# Patient Record
Sex: Female | Born: 1965 | Race: White | Hispanic: No | Marital: Married | Smoking: Never smoker
Health system: Southern US, Community
[De-identification: ages and names within clinical notes are randomized; demographics above are authoritative.]

---

## 2005-09-12 ENCOUNTER — Ambulatory Visit: Payer: Self-pay | Admitting: Family Medicine

## 2006-09-20 ENCOUNTER — Ambulatory Visit: Payer: Self-pay | Admitting: Family Medicine

## 2007-10-09 ENCOUNTER — Ambulatory Visit: Payer: Self-pay | Admitting: Family Medicine

## 2008-11-24 ENCOUNTER — Ambulatory Visit: Payer: Self-pay | Admitting: Family Medicine

## 2009-11-30 ENCOUNTER — Ambulatory Visit: Payer: Self-pay | Admitting: Family Medicine

## 2010-12-06 ENCOUNTER — Ambulatory Visit: Payer: Self-pay | Admitting: Family Medicine

## 2012-05-14 ENCOUNTER — Ambulatory Visit: Payer: Self-pay | Admitting: Family Medicine

## 2013-05-15 ENCOUNTER — Ambulatory Visit: Payer: Self-pay | Admitting: Family Medicine

## 2014-05-26 ENCOUNTER — Ambulatory Visit: Payer: Self-pay | Admitting: Family Medicine

## 2014-06-09 ENCOUNTER — Ambulatory Visit: Payer: Self-pay | Admitting: Gastroenterology

## 2014-12-04 HISTORY — PX: COLONOSCOPY: SHX174

## 2015-11-08 ENCOUNTER — Other Ambulatory Visit: Payer: Self-pay | Admitting: Family Medicine

## 2015-11-08 DIAGNOSIS — Z1231 Encounter for screening mammogram for malignant neoplasm of breast: Secondary | ICD-10-CM

## 2015-11-15 ENCOUNTER — Ambulatory Visit
Admission: RE | Admit: 2015-11-15 | Discharge: 2015-11-15 | Disposition: A | Discharge status: Home / Self Care | Payer: Managed Care, Other (non HMO) | Source: Ambulatory Visit | Attending: Family Medicine | Admitting: Family Medicine

## 2015-11-15 DIAGNOSIS — Z1231 Encounter for screening mammogram for malignant neoplasm of breast: Secondary | ICD-10-CM | POA: Insufficient documentation

## 2017-02-06 ENCOUNTER — Other Ambulatory Visit: Payer: Self-pay | Admitting: Family Medicine

## 2017-02-06 DIAGNOSIS — Z1231 Encounter for screening mammogram for malignant neoplasm of breast: Secondary | ICD-10-CM

## 2017-03-06 ENCOUNTER — Ambulatory Visit
Admission: RE | Admit: 2017-03-06 | Discharge: 2017-03-06 | Disposition: A | Discharge status: Home / Self Care | Payer: Managed Care, Other (non HMO) | Source: Ambulatory Visit | Attending: Family Medicine | Admitting: Family Medicine

## 2017-03-06 DIAGNOSIS — Z1231 Encounter for screening mammogram for malignant neoplasm of breast: Secondary | ICD-10-CM | POA: Diagnosis present

## 2018-02-07 ENCOUNTER — Other Ambulatory Visit: Payer: Self-pay | Admitting: Family Medicine

## 2018-02-07 DIAGNOSIS — Z1231 Encounter for screening mammogram for malignant neoplasm of breast: Secondary | ICD-10-CM

## 2018-03-07 ENCOUNTER — Ambulatory Visit
Admission: RE | Admit: 2018-03-07 | Discharge: 2018-03-07 | Disposition: A | Discharge status: Home / Self Care | Payer: 59 | Source: Ambulatory Visit | Attending: Family Medicine | Admitting: Family Medicine

## 2018-03-07 DIAGNOSIS — Z1231 Encounter for screening mammogram for malignant neoplasm of breast: Secondary | ICD-10-CM

## 2018-03-27 NOTE — H&P (Signed)
Tammy Baldwin is a 52 y.o. female here for TAh and bilateral salpingectomy . Referred from [facility] for heavy menses for the last 1 yr . Bleeds for 1-2 weeks at a time sometimes flood and has to change pad q 1 hr  G1P1 with c/s   u/s :Uterus retroverted  Fibroids seen: 1)Lt lat=3.5cm     2)Rt lat=2.3cm    3)Lt mid=1.7cm    4)fundal=2.9cm    5)fundal=8.2cm (seen transabdominally)  Endometrium=13.30mm  No free fluid seen in CDS's  LOV appears wnl  ROV contains 2 simple cysts: 1)2.1cm     2)1.3cm   EMBX : neg   pap : [facility]    Past Medical History:  has a past medical history of Epilepsy (CMS-HCC) and Gave birth to child recently (1999).  Past Surgical History:  has a past surgical history that includes Colonoscopy (06/09/2014) and Cesarean section (1999). Family History: family history includes Allergic rhinitis in her brother and mother; Atrial fibrillation (Abnormal heart rhythm sometimes requiring treatment with blood thinners) in her maternal aunt; Epilepsy in her brother; HIV in her brother; Stroke in her maternal aunt and maternal aunt. Social History:  reports that she has never smoked. She has never used smokeless tobacco. She reports that she does not drink alcohol or use drugs. OB/GYN History:          OB History    Gravida  1   Para  1   Term  1   Preterm      AB      Living  1     SAB      TAB      Ectopic      Molar      Multiple      Live Births  1          Allergies: is allergic to penicillins. Medications:  Current Outpatient Medications:  .  ascorbic acid (VITAMIN C) 1000 MG tablet, Take 1,000 mg by mouth once daily., Disp: , Rfl:  .  multivitamin (MULTIVITAMIN) tablet, Take 1 tablet by mouth once daily., Disp: , Rfl:  .  omega-3 fatty acids-vitamin E (FISH OIL) 1,000 mg, Take 1 tablet by mouth once daily., Disp: , Rfl:  .  tranexamic acid (LYSTEDA) 650 mg tablet, Take 2 tablets (1,300 mg total) by mouth 3 (three) times daily Take  for a maximum of 5 days during monthly menstruation., Disp: 30 tablet, Rfl: 3 .  VITAMIN B COMPLEX ORAL, Take by mouth once daily., Disp: , Rfl:   Review of Systems: General:                      No fatigue or weight loss Eyes:                           No vision changes Ears:                            No hearing difficulty Respiratory:                No cough or shortness of breath Pulmonary:                  No asthma or shortness of breath Cardiovascular:           No chest pain, palpitations, dyspnea on exertion Gastrointestinal:  No abdominal bloating, chronic diarrhea, constipations, masses, pain or hematochezia Genitourinary:             No hematuria, dysuria, abnormal vaginal discharge, pelvic pain,++ Menometrorrhagia Lymphatic:                   No swollen lymph nodes Musculoskeletal:         No muscle weakness Neurologic:                  No extremity weakness, syncope, seizure disorder Psychiatric:                  No history of depression, delusions or suicidal/homicidal ideation    Exam:      Vitals:   03/19/18 1142  BP: 127/77  Pulse: 70    Body mass index is 30.21 kg/m.  WDWN white/ female in NAD   Lungs: CTA  CV : RRR without murmur    Neck:  no thyromegaly Abdomen: soft , 20 week firm utx just inferior to umbilicus Pelvic: tanner stage 5 ,  External genitalia: vulva /labia no lesions Urethra: no prolapse Vagina: normal physiologic d/c Cervix: no lesions, no cervical motion tenderness   Uterus: 20 weeks irregular shape, mobile   Adnexa: no mass,  non-tender   Rectovaginal:  :   Impression:   The primary encounter diagnosis was Abnormal uterine bleeding (AUB), unspecified. A diagnosis of Uterine leiomyoma, unspecified location was also pertinent to this visit.    Plan:   Recommend surgical intervention ie  TAH and bilateral salpingectomy  Risks of the procedure have been discussed with her  Including organ injury , dvt ,  blood transfusions risks , infection . All questions answered .          Caroline Sauger, MD       Electronically signed by Caroline Sauger, MD on 03/27/2018 12:12 PM      Office Visit on 03/19/2018

## 2018-03-28 ENCOUNTER — Encounter
Admission: RE | Admit: 2018-03-28 | Discharge: 2018-03-28 | Disposition: A | Discharge status: Home / Self Care | Payer: 59 | Source: Ambulatory Visit | Attending: Obstetrics and Gynecology | Admitting: Obstetrics and Gynecology

## 2018-03-28 ENCOUNTER — Other Ambulatory Visit: Payer: Self-pay

## 2018-03-28 DIAGNOSIS — Z01812 Encounter for preprocedural laboratory examination: Secondary | ICD-10-CM | POA: Insufficient documentation

## 2018-03-28 LAB — TYPE AND SCREEN
ABO/RH(D): A POS
ANTIBODY SCREEN: NEGATIVE

## 2018-03-28 LAB — CBC
HCT: 43.6 % (ref 35.0–47.0)
Hemoglobin: 14.9 g/dL (ref 12.0–16.0)
MCH: 32.5 pg (ref 26.0–34.0)
MCHC: 34.1 g/dL (ref 32.0–36.0)
MCV: 95.4 fL (ref 80.0–100.0)
PLATELETS: 299 10*3/uL (ref 150–440)
RBC: 4.57 MIL/uL (ref 3.80–5.20)
RDW: 13.3 % (ref 11.5–14.5)
WBC: 6 10*3/uL (ref 3.6–11.0)

## 2018-03-28 LAB — BASIC METABOLIC PANEL
Anion gap: 7 (ref 5–15)
BUN: 15 mg/dL (ref 6–20)
CALCIUM: 8.8 mg/dL — AB (ref 8.9–10.3)
CO2: 26 mmol/L (ref 22–32)
CREATININE: 0.95 mg/dL (ref 0.44–1.00)
Chloride: 107 mmol/L (ref 101–111)
GFR calc Af Amer: >60 mL/min (ref >60)
Glucose, Bld: 85 mg/dL (ref 65–99)
Potassium: 4 mmol/L (ref 3.5–5.1)
SODIUM: 140 mmol/L (ref 135–145)

## 2018-03-28 NOTE — Patient Instructions (Signed)
Your procedure is scheduled on: Monday 04/01/18 Report to [facility]. To find out your arrival time please call [phone] between 1PM - 3PM on Friday 03/29/18.  Remember: Instructions that are not followed completely may result in serious medical risk, up to and including death, or upon the discretion of your surgeon and anesthesiologist your surgery may need to be rescheduled.     _X__ 1. Do not eat food after midnight the night before your procedure.                 No gum chewing or hard candies. You may drink clear liquids up to 2 hours                 before you are scheduled to arrive for your surgery- DO not drink clear                 liquids within 2 hours of the start of your surgery.                 Clear Liquids include:  water, apple juice without pulp, clear carbohydrate                 drink such as Clearfast or Gatorade, Black Coffee or Tea (Do not add                 anything to coffee or tea).  __X__2.  On the morning of surgery brush your teeth with toothpaste and water, you                 may rinse your mouth with mouthwash if you wish.  Do not swallow any              toothpaste of mouthwash.     _X__ 3.  No Alcohol for 24 hours before or after surgery.   _X__ 4.  Do Not Smoke or use e-cigarettes For 24 Hours Prior to Your Surgery.                 Do not use any chewable tobacco products for at least 6 hours prior to                 surgery.  ____  5.  Bring all medications with you on the day of surgery if instructed.   __X__  6.  Notify your doctor if there is any change in your medical condition      (cold, fever, infections).     Do not wear jewelry, make-up, hairpins, clips or nail polish. Do not wear lotions, powders, or perfumes.  Do not shave 48 hours prior to surgery. Men may shave face and neck. Do not bring valuables to the hospital.    [facility] is not responsible for any belongings or  valuables.  Contacts, dentures/partials or body piercings may not be worn into surgery. Bring a case for your contacts, glasses or hearing aids, a denture cup will be supplied. Leave your suitcase in the car. After surgery it may be brought to your room. For patients admitted to the hospital, discharge time is determined by your treatment team.   Patients discharged the day of surgery will not be allowed to drive home.   Please read over the following fact sheets that you were given:   MRSA Information  __X__ Take these medicines the morning of surgery with A SIP OF WATER:  1. NONE  2.   3.   4.  5.  6.  ____ Fleet Enema (as directed)   __X__ Use CHG Soap/SAGE wipes as directed  ____ Use inhalers on the day of surgery  ____ Stop metformin/Janumet/Farxiga 2 days prior to surgery    ____ Take 1/2 of usual insulin dose the night before surgery. No insulin the morning          of surgery.   ____ Stop Blood Thinners Coumadin/Plavix/Xarelto/Pleta/Pradaxa/Eliquis/Effient/Aspirin  on   Or contact your Surgeon, Cardiologist or Medical Doctor regarding  ability to stop your blood thinners  __X__ Stop Anti-inflammatories 7 days before surgery such as Advil, Ibuprofen, Motrin,  BC or Goodies Powder, Naprosyn, Naproxen, Aleve, Aspirin TODAY   __X__ Stop all herbal supplements, fish oil or vitamin E until after surgery.  STOP GLUCOSAMINE, FISH OIL, VITAMIN C, IBUPROFEN, MAY USE TYLENOL  ____ Bring C-Pap to the hospital.

## 2018-04-01 ENCOUNTER — Inpatient Hospital Stay
Admission: RE | Admit: 2018-04-01 | Discharge: 2018-04-03 | DRG: 743 | Disposition: A | Discharge status: Home / Self Care | Payer: 59 | Source: Ambulatory Visit | Attending: Obstetrics and Gynecology | Admitting: Obstetrics and Gynecology

## 2018-04-01 ENCOUNTER — Inpatient Hospital Stay: Payer: 59 | Admitting: Certified Registered Nurse Anesthetist

## 2018-04-01 ENCOUNTER — Encounter: Payer: Self-pay | Admitting: *Deleted

## 2018-04-01 ENCOUNTER — Encounter
Admission: RE | Disposition: A | Discharge status: Home / Self Care | Payer: Self-pay | Source: Ambulatory Visit | Attending: Obstetrics and Gynecology

## 2018-04-01 ENCOUNTER — Other Ambulatory Visit: Payer: Self-pay

## 2018-04-01 DIAGNOSIS — Z880 Allergy status to penicillin: Secondary | ICD-10-CM

## 2018-04-01 DIAGNOSIS — N920 Excessive and frequent menstruation with regular cycle: Secondary | ICD-10-CM | POA: Diagnosis present

## 2018-04-01 DIAGNOSIS — Z9889 Other specified postprocedural states: Secondary | ICD-10-CM

## 2018-04-01 DIAGNOSIS — D251 Intramural leiomyoma of uterus: Principal | ICD-10-CM | POA: Diagnosis present

## 2018-04-01 DIAGNOSIS — N838 Other noninflammatory disorders of ovary, fallopian tube and broad ligament: Secondary | ICD-10-CM | POA: Diagnosis present

## 2018-04-01 DIAGNOSIS — D259 Leiomyoma of uterus, unspecified: Secondary | ICD-10-CM | POA: Diagnosis present

## 2018-04-01 HISTORY — PX: HYSTERECTOMY ABDOMINAL WITH SALPINGECTOMY: SHX6725

## 2018-04-01 LAB — POCT PREGNANCY, URINE: Preg Test, Ur: NEGATIVE

## 2018-04-01 LAB — ABO/RH: ABO/RH(D): A POS

## 2018-04-01 SURGERY — HYSTERECTOMY, TOTAL, ABDOMINAL, WITH SALPINGECTOMY
Anesthesia: General | Laterality: Bilateral

## 2018-04-01 MED ORDER — CEFAZOLIN SODIUM-DEXTROSE 2-4 GM/100ML-% IV SOLN
2.0000 g | Freq: Once | INTRAVENOUS | Status: AC
  Administered 2018-04-01: 2 g via INTRAVENOUS

## 2018-04-01 MED ORDER — KETOROLAC TROMETHAMINE 30 MG/ML IJ SOLN
INTRAMUSCULAR | Status: AC
  Filled 2018-04-01: qty 1

## 2018-04-01 MED ORDER — LACTATED RINGERS IV SOLN
INTRAVENOUS | Status: DC
  Administered 2018-04-01 (×2): via INTRAVENOUS

## 2018-04-01 MED ORDER — MORPHINE SULFATE (PF) 2 MG/ML IV SOLN
1.0000 mg | INTRAVENOUS | Status: DC | PRN
  Administered 2018-04-01: 2 mg via INTRAVENOUS
  Filled 2018-04-01: qty 1

## 2018-04-01 MED ORDER — BUPIVACAINE HCL (PF) 0.5 % IJ SOLN
INTRAMUSCULAR | Status: AC
  Filled 2018-04-01: qty 30

## 2018-04-01 MED ORDER — PROPOFOL 10 MG/ML IV BOLUS
INTRAVENOUS | Status: DC | PRN
  Administered 2018-04-01: 150 mg via INTRAVENOUS

## 2018-04-01 MED ORDER — BUPIVACAINE HCL (PF) 0.5 % IJ SOLN
INTRAMUSCULAR | Status: DC | PRN
  Administered 2018-04-01: 25 mL

## 2018-04-01 MED ORDER — PROPOFOL 10 MG/ML IV BOLUS
INTRAVENOUS | Status: AC
  Filled 2018-04-01: qty 20

## 2018-04-01 MED ORDER — SUGAMMADEX SODIUM 200 MG/2ML IV SOLN
INTRAVENOUS | Status: AC
  Filled 2018-04-01: qty 2

## 2018-04-01 MED ORDER — ACETAMINOPHEN 10 MG/ML IV SOLN
INTRAVENOUS | Status: DC | PRN
  Administered 2018-04-01: 1000 mg via INTRAVENOUS

## 2018-04-01 MED ORDER — FENTANYL CITRATE (PF) 250 MCG/5ML IJ SOLN
INTRAMUSCULAR | Status: AC
  Filled 2018-04-01: qty 5

## 2018-04-01 MED ORDER — CEFAZOLIN SODIUM-DEXTROSE 2-4 GM/100ML-% IV SOLN
INTRAVENOUS | Status: AC
  Filled 2018-04-01: qty 100

## 2018-04-01 MED ORDER — ACETAMINOPHEN 10 MG/ML IV SOLN
INTRAVENOUS | Status: AC
  Filled 2018-04-01: qty 100

## 2018-04-01 MED ORDER — ONDANSETRON HCL 4 MG PO TABS: 4.0000 mg | ORAL_TABLET | Freq: Four times a day (QID) | ORAL | Status: DC | PRN

## 2018-04-01 MED ORDER — ONDANSETRON HCL 4 MG/2ML IJ SOLN
INTRAMUSCULAR | Status: AC
  Filled 2018-04-01: qty 2

## 2018-04-01 MED ORDER — LACTATED RINGERS IV SOLN
INTRAVENOUS | Status: DC
  Administered 2018-04-01 – 2018-04-02 (×2): via INTRAVENOUS

## 2018-04-01 MED ORDER — LIDOCAINE HCL (CARDIAC) PF 100 MG/5ML IV SOSY
PREFILLED_SYRINGE | INTRAVENOUS | Status: DC | PRN
  Administered 2018-04-01: 60 mg via INTRAVENOUS

## 2018-04-01 MED ORDER — KETAMINE HCL 50 MG/ML IJ SOLN
INTRAMUSCULAR | Status: DC | PRN
Start: 2018-04-01 — End: 2018-04-01
  Administered 2018-04-01: 50 mg via INTRAMUSCULAR

## 2018-04-01 MED ORDER — MIDAZOLAM HCL 2 MG/2ML IJ SOLN
INTRAMUSCULAR | Status: AC
  Filled 2018-04-01: qty 2

## 2018-04-01 MED ORDER — FAMOTIDINE 20 MG PO TABS
ORAL_TABLET | ORAL | Status: AC
  Administered 2018-04-01: 20 mg via ORAL
  Filled 2018-04-01: qty 1

## 2018-04-01 MED ORDER — ONDANSETRON HCL 4 MG/2ML IJ SOLN: 4.0000 mg | Freq: Once | INTRAMUSCULAR | Status: DC | PRN

## 2018-04-01 MED ORDER — ONDANSETRON HCL 4 MG/2ML IJ SOLN
4.0000 mg | Freq: Four times a day (QID) | INTRAMUSCULAR | Status: DC | PRN
  Administered 2018-04-01: 4 mg via INTRAVENOUS
  Filled 2018-04-01: qty 2

## 2018-04-01 MED ORDER — SUGAMMADEX SODIUM 200 MG/2ML IV SOLN
INTRAVENOUS | Status: DC | PRN
  Administered 2018-04-01: 200 mg via INTRAVENOUS

## 2018-04-01 MED ORDER — ONDANSETRON HCL 4 MG/2ML IJ SOLN
INTRAMUSCULAR | Status: DC | PRN
  Administered 2018-04-01: 4 mg via INTRAVENOUS

## 2018-04-01 MED ORDER — FAMOTIDINE 20 MG PO TABS
20.0000 mg | ORAL_TABLET | Freq: Once | ORAL | Status: AC
  Administered 2018-04-01: 20 mg via ORAL

## 2018-04-01 MED ORDER — BUPIVACAINE LIPOSOME 1.3 % IJ SUSP
INTRAMUSCULAR | Status: DC | PRN
  Administered 2018-04-01: 65 mL

## 2018-04-01 MED ORDER — ROCURONIUM BROMIDE 100 MG/10ML IV SOLN
INTRAVENOUS | Status: DC | PRN
  Administered 2018-04-01: 50 mg via INTRAVENOUS
  Administered 2018-04-01: 20 mg via INTRAVENOUS

## 2018-04-01 MED ORDER — DEXAMETHASONE SODIUM PHOSPHATE 10 MG/ML IJ SOLN
INTRAMUSCULAR | Status: AC
  Filled 2018-04-01: qty 1

## 2018-04-01 MED ORDER — BUPIVACAINE LIPOSOME 1.3 % IJ SUSP
INTRAMUSCULAR | Status: AC
  Filled 2018-04-01: qty 20

## 2018-04-01 MED ORDER — FENTANYL CITRATE (PF) 100 MCG/2ML IJ SOLN: 25.0000 ug | INTRAMUSCULAR | Status: DC | PRN

## 2018-04-01 MED ORDER — OXYCODONE-ACETAMINOPHEN 5-325 MG PO TABS
1.0000 | ORAL_TABLET | ORAL | Status: DC | PRN
  Administered 2018-04-01 (×2): 2 via ORAL
  Administered 2018-04-02 (×2): 1 via ORAL
  Administered 2018-04-02 (×2): 2 via ORAL
  Administered 2018-04-02: 1 via ORAL
  Filled 2018-04-01 (×3): qty 2
  Filled 2018-04-01 (×3): qty 1
  Filled 2018-04-01: qty 2

## 2018-04-01 MED ORDER — MIDAZOLAM HCL 2 MG/2ML IJ SOLN
INTRAMUSCULAR | Status: DC | PRN
  Administered 2018-04-01: 2 mg via INTRAVENOUS

## 2018-04-01 MED ORDER — FLEET ENEMA 7-19 GM/118ML RE ENEM: 1.0000 | ENEMA | Freq: Once | RECTAL | Status: DC

## 2018-04-01 MED ORDER — KETOROLAC TROMETHAMINE 30 MG/ML IJ SOLN
INTRAMUSCULAR | Status: DC | PRN
  Administered 2018-04-01: 30 mg via INTRAVENOUS

## 2018-04-01 MED ORDER — DEXAMETHASONE SODIUM PHOSPHATE 10 MG/ML IJ SOLN
INTRAMUSCULAR | Status: DC | PRN
  Administered 2018-04-01: 10 mg via INTRAVENOUS

## 2018-04-01 MED ORDER — PHENYLEPHRINE HCL 10 MG/ML IJ SOLN
INTRAMUSCULAR | Status: DC | PRN
  Administered 2018-04-01: 50 ug via INTRAVENOUS

## 2018-04-01 MED ORDER — KETOROLAC TROMETHAMINE 30 MG/ML IJ SOLN
30.0000 mg | Freq: Three times a day (TID) | INTRAMUSCULAR | Status: DC | PRN
  Administered 2018-04-01 – 2018-04-02 (×3): 30 mg via INTRAVENOUS
  Filled 2018-04-01 (×3): qty 1

## 2018-04-01 MED ORDER — LIDOCAINE HCL (PF) 2 % IJ SOLN
INTRAMUSCULAR | Status: AC
  Filled 2018-04-01: qty 10

## 2018-04-01 MED ORDER — FENTANYL CITRATE (PF) 100 MCG/2ML IJ SOLN
INTRAMUSCULAR | Status: DC | PRN
  Administered 2018-04-01: 100 ug via INTRAVENOUS
  Administered 2018-04-01 (×2): 25 ug via INTRAVENOUS

## 2018-04-01 MED ORDER — SIMETHICONE 80 MG PO CHEW
80.0000 mg | CHEWABLE_TABLET | Freq: Four times a day (QID) | ORAL | Status: DC | PRN
  Administered 2018-04-02 – 2018-04-03 (×2): 80 mg via ORAL
  Filled 2018-04-01 (×2): qty 1

## 2018-04-01 SURGICAL SUPPLY — 42 items
CANISTER SUCT 1200ML W/VALVE (MISCELLANEOUS) ×3 IMPLANT
CHLORAPREP W/TINT 26ML (MISCELLANEOUS) ×3 IMPLANT
DRAPE LAP W/FLUID (DRAPES) ×3 IMPLANT
DRAPE UNDER BUTTOCK W/FLU (DRAPES) ×3 IMPLANT
DRSG TELFA 3X8 NADH (GAUZE/BANDAGES/DRESSINGS) ×3 IMPLANT
ELECT BLADE 6.5 EXT (BLADE) ×3 IMPLANT
ELECT CAUTERY BLADE 6.4 (BLADE) ×3 IMPLANT
ELECT REM PT RETURN 9FT ADLT (ELECTROSURGICAL) ×3
ELECTRODE REM PT RTRN 9FT ADLT (ELECTROSURGICAL) ×1 IMPLANT
GAUZE SPONGE 4X4 12PLY STRL (GAUZE/BANDAGES/DRESSINGS) ×3 IMPLANT
GELPOINT ADV PLATFORM (ENDOMECHANICALS) ×3
GLOVE BIO SURGEON STRL SZ7 (GLOVE) ×3 IMPLANT
GLOVE BIO SURGEON STRL SZ8 (GLOVE) ×3 IMPLANT
GLOVE BIOGEL PI IND STRL 6.5 (GLOVE) ×1 IMPLANT
GLOVE BIOGEL PI INDICATOR 6.5 (GLOVE) ×2
GOWN STRL REUS W/ TWL LRG LVL3 (GOWN DISPOSABLE) ×2 IMPLANT
GOWN STRL REUS W/ TWL XL LVL3 (GOWN DISPOSABLE) ×1 IMPLANT
GOWN STRL REUS W/TWL LRG LVL3 (GOWN DISPOSABLE) ×4
GOWN STRL REUS W/TWL XL LVL3 (GOWN DISPOSABLE) ×2
KIT TURNOVER CYSTO (KITS) ×3 IMPLANT
LABEL OR SOLS (LABEL) ×3 IMPLANT
PACK BASIN MAJOR ARMC (MISCELLANEOUS) ×3 IMPLANT
PLATFORM STD W/COL CELL SVR (ENDOMECHANICALS) ×1 IMPLANT
RETAINER VISCERA MED (MISCELLANEOUS) IMPLANT
SOL PREP PVP 2OZ (MISCELLANEOUS) ×3
SOLUTION PREP PVP 2OZ (MISCELLANEOUS) ×1 IMPLANT
SPONGE XRAY 4X4 16PLY STRL (MISCELLANEOUS) ×3 IMPLANT
STAPLER INSORB 30 2030 C-SECTI (MISCELLANEOUS) IMPLANT
STAPLER SKIN PROX 35W (STAPLE) IMPLANT
SURGILUBE 2OZ TUBE FLIPTOP (MISCELLANEOUS) ×3 IMPLANT
SUT CHROMIC 2 0 CT 1 (SUTURE) ×3 IMPLANT
SUT PDS AB 1 TP1 96 (SUTURE) ×3 IMPLANT
SUT VIC AB 0 CT1 27 (SUTURE) ×4
SUT VIC AB 0 CT1 27XCR 8 STRN (SUTURE) ×2 IMPLANT
SUT VIC AB 0 CT1 36 (SUTURE) ×3 IMPLANT
SUT VIC AB 2-0 SH 27 (SUTURE) ×6
SUT VIC AB 2-0 SH 27XBRD (SUTURE) ×3 IMPLANT
SUT VICRYL PLUS ABS 0 54 (SUTURE) ×3 IMPLANT
SYR BULB IRRIG 60ML STRL (SYRINGE) ×3 IMPLANT
TRAY FOLEY W/METER SILVER 16FR (SET/KITS/TRAYS/PACK) ×3 IMPLANT
TRAY PREP VAG/GEN (MISCELLANEOUS) ×3 IMPLANT
WATER STERILE IRR 1000ML POUR (IV SOLUTION) ×3 IMPLANT

## 2018-04-01 NOTE — Anesthesia Procedure Notes (Signed)
Procedure Name: Intubation Date/Time: 04/01/2018 10:51 AM Performed by: Eben Burow, CRNA Pre-anesthesia Checklist: Patient identified, Emergency Drugs available, Suction available, Patient being monitored and Timeout performed Patient Re-evaluated:Patient Re-evaluated prior to induction Oxygen Delivery Method: Circle system utilized Preoxygenation: Pre-oxygenation with 100% oxygen Induction Type: IV induction Ventilation: Mask ventilation without difficulty Laryngoscope Size: Miller and 2 Grade View: Grade I Tube type: Oral Tube size: 7.0 mm Number of attempts: 1 Airway Equipment and Method: Stylet Placement Confirmation: ETT inserted through vocal cords under direct vision,  positive ETCO2 and breath sounds checked- equal and bilateral Secured at: 21 cm Tube secured with: Tape Dental Injury: Teeth and Oropharynx as per pre-operative assessment  Comments: Fever blister noted to upper lip - site unchanged after intubation

## 2018-04-01 NOTE — Anesthesia Post-op Follow-up Note (Signed)
Anesthesia QCDR form completed.        

## 2018-04-01 NOTE — Anesthesia Preprocedure Evaluation (Signed)
Anesthesia Evaluation  Patient identified by MRN, date of birth, ID band Patient awake    Reviewed: Allergy & Precautions, NPO status , Patient's Chart, lab work & pertinent test results  Airway Mallampati: III  TM Distance: >3 FB     Dental   Pulmonary neg pulmonary ROS,    Pulmonary exam normal        Cardiovascular negative cardio ROS Normal cardiovascular exam     Neuro/Psych negative neurological ROS  negative psych ROS   GI/Hepatic negative GI ROS, Neg liver ROS,   Endo/Other  negative endocrine ROS  Renal/GU negative Renal ROS  Female GU complaint     Musculoskeletal negative musculoskeletal ROS (+)   Abdominal Normal abdominal exam  (+)   Peds negative pediatric ROS (+)  Hematology negative hematology ROS (+)   Anesthesia Other Findings   Reproductive/Obstetrics                             Anesthesia Physical Anesthesia Plan  ASA: II  Anesthesia Plan: General   Post-op Pain Management:    Induction: Intravenous  PONV Risk Score and Plan:   Airway Management Planned: Oral ETT  Additional Equipment:   Intra-op Plan:   Post-operative Plan: Extubation in OR  Informed Consent: I have reviewed the patients History and Physical, chart, labs and discussed the procedure including the risks, benefits and alternatives for the proposed anesthesia with the patient or authorized representative who has indicated his/her understanding and acceptance.   Dental advisory given  Plan Discussed with: CRNA and Surgeon  Anesthesia Plan Comments:         Anesthesia Quick Evaluation

## 2018-04-01 NOTE — Progress Notes (Signed)
Pt is ready for surgery . TAH  Bilateral salpingectomy  All questions answered . LAbs reviewed . Proceed

## 2018-04-01 NOTE — Transfer of Care (Signed)
Immediate Anesthesia Transfer of Care Note  Patient: Tammy Baldwin  Procedure(s) Performed: HYSTERECTOMY ABDOMINAL WITH SALPINGECTOMY (Bilateral )  Patient Location: PACU  Anesthesia Type:General  Level of Consciousness: drowsy  Airway & Oxygen Therapy: Patient Spontanous Breathing and Patient connected to face mask oxygen  Post-op Assessment: Report given to RN and Post -op Vital signs reviewed and stable  Post vital signs: Reviewed and stable  Last Vitals:  Vitals Value Taken Time  BP 106/72 04/01/2018  1:16 PM  Temp    Pulse 61 04/01/2018  1:18 PM  Resp 21 04/01/2018  1:18 PM  SpO2 98 % 04/01/2018  1:18 PM  Vitals shown include unvalidated device data.  Last Pain:  Vitals:   04/01/18 0942  TempSrc: Tympanic         Complications: No apparent anesthesia complications

## 2018-04-01 NOTE — Brief Op Note (Signed)
04/01/2018  12:57 PM  PATIENT:  Gwynn Burly  52 y.o. female  PRE-OPERATIVE DIAGNOSIS:  Menorrhagia, Fibroids  POST-OPERATIVE DIAGNOSIS:  Menorrhagia, Fibroids  PROCEDURE:  Procedure(s): HYSTERECTOMY ABDOMINAL WITH SALPINGECTOMY (Bilateral)  SURGEON:  Surgeon(s) and Role:    * Schermerhorn, Gwen Her, MD - Primary    * Ward, Honor Loh, MD - Assisting  PHYSICIAN ASSISTANT: PA student Babin  ASSISTANTS: none   ANESTHESIA:   general  EBL:  100 mL   BLOOD ADMINISTERED:none  DRAINS: Urinary Catheter (Foley)   LOCAL MEDICATIONS USED:  MARCAINE   , BUPIVICAINE , Amount: 90 cc of solution  ml and NS  SPECIMEN:  Source of Specimen:  cervix , utus and bilateral fallopian tube   DISPOSITION OF SPECIMEN:  PATHOLOGY  COUNTS:  YES  TOURNIQUET:  * No tourniquets in log *  DICTATION: .Other Dictation: Dictation Number verbal  PLAN OF CARE: Admit to inpatient   PATIENT DISPOSITION:  PACU - hemodynamically stable.   Delay start of Pharmacological VTE agent (>24hrs) due to surgical blood loss or risk of bleeding: not applicable

## 2018-04-01 NOTE — Progress Notes (Signed)
Patient ID: Tammy Baldwin, female   DOB: 08-15-1966, 52 y.o.   MRN: [id number] DOS . Low back pain . Needing Morphine to control pain  VSS  urine output adequate .  Cont care

## 2018-04-01 NOTE — Anesthesia Postprocedure Evaluation (Signed)
Anesthesia Post Note  Patient: Tammy Baldwin  Procedure(s) Performed: HYSTERECTOMY ABDOMINAL WITH SALPINGECTOMY (Bilateral )  Patient location during evaluation: PACU Anesthesia Type: General Level of consciousness: awake and alert and oriented Pain management: pain level controlled Vital Signs Assessment: post-procedure vital signs reviewed and stable Respiratory status: spontaneous breathing Cardiovascular status: blood pressure returned to baseline Anesthetic complications: no     Last Vitals:  Vitals:   04/01/18 1426 04/01/18 1527  BP: 107/73 108/69  Pulse: 60 67  Resp:  18  Temp: 37.1 C 36.7 C  SpO2: 100% 99%    Last Pain:  Vitals:   04/01/18 1527  TempSrc: Oral  PainSc:                  Maily Debarge

## 2018-04-02 ENCOUNTER — Encounter: Payer: Self-pay | Admitting: Obstetrics and Gynecology

## 2018-04-02 LAB — CBC
HEMATOCRIT: 37.4 % (ref 35.0–47.0)
Hemoglobin: 12.8 g/dL (ref 12.0–16.0)
MCH: 32.8 pg (ref 26.0–34.0)
MCHC: 34.2 g/dL (ref 32.0–36.0)
MCV: 95.9 fL (ref 80.0–100.0)
Platelets: 287 10*3/uL (ref 150–440)
RBC: 3.9 MIL/uL (ref 3.80–5.20)
RDW: 13.3 % (ref 11.5–14.5)
WBC: 14.2 10*3/uL — ABNORMAL HIGH (ref 3.6–11.0)

## 2018-04-02 LAB — BASIC METABOLIC PANEL
Anion gap: 4 — ABNORMAL LOW (ref 5–15)
BUN: 13 mg/dL (ref 6–20)
CHLORIDE: 107 mmol/L (ref 101–111)
CO2: 26 mmol/L (ref 22–32)
CREATININE: 0.78 mg/dL (ref 0.44–1.00)
Calcium: 8.1 mg/dL — ABNORMAL LOW (ref 8.9–10.3)
GFR calc non Af Amer: >60 mL/min (ref >60)
Glucose, Bld: 117 mg/dL — ABNORMAL HIGH (ref 65–99)
POTASSIUM: 4.3 mmol/L (ref 3.5–5.1)
Sodium: 137 mmol/L (ref 135–145)

## 2018-04-02 LAB — SURGICAL PATHOLOGY

## 2018-04-02 MED ORDER — POLYETHYLENE GLYCOL 3350 17 G PO PACK
17.0000 g | PACK | Freq: Once | ORAL | Status: AC
  Administered 2018-04-02: 17 g via ORAL
  Filled 2018-04-02: qty 1

## 2018-04-02 MED ORDER — POLYETHYLENE GLYCOL 3350 17 G PO PACK
17.0000 g | PACK | Freq: Every day | ORAL | Status: DC
  Filled 2018-04-02: qty 1

## 2018-04-02 MED ORDER — IBUPROFEN 800 MG PO TABS
800.0000 mg | ORAL_TABLET | Freq: Three times a day (TID) | ORAL | Status: DC | PRN
  Administered 2018-04-02: 800 mg via ORAL
  Filled 2018-04-02: qty 1

## 2018-04-02 MED ORDER — GLYCERIN (LAXATIVE) 2.1 G RE SUPP
1.0000 | Freq: Once | RECTAL | Status: AC
  Administered 2018-04-02: 1 via RECTAL
  Filled 2018-04-02: qty 1

## 2018-04-02 NOTE — Discharge Instructions (Signed)

## 2018-04-02 NOTE — Op Note (Signed)
Tammy Baldwin, Tammy Baldwin                   ACCOUNT NO.:  [id number]  MEDICAL RECORD NO.:  [id number]  LOCATION:                                 FACILITY:  PHYSICIAN:  Laverta Baltimore, MD     DATE OF BIRTH:  DATE OF PROCEDURE:  04/01/2018 DATE OF DISCHARGE:                              OPERATIVE REPORT   PREOPERATIVE DIAGNOSIS: 1. Symptomatic fibroid uterus. 2. Menorrhagia.  POSTOPERATIVE DIAGNOSIS: 1. Symptomatic fibroid uterus. 2. Menorrhagia.  PROCEDURE PERFORMED: 1. Total abdominal hysterectomy. 2. Bilateral salpingectomy.  SURGEON:  Laverta Baltimore, MD  ANESTHESIA:  General endotracheal anesthesia.  ASSISTANTS: 1. Maceo Pro, MD. 2. PA student, Hardie Lora.  INDICATIONS:  A 52 year old gravida 1, para 1 patient with a long history of menorrhagia, bleeds for up to 2 weeks at a time, changing pads every hour.  The patient is known to have multiple fibroids on recent ultrasound.  Endometrial biopsy is negative.  DESCRIPTION OF PROCEDURE:  After adequate general endotracheal anesthesia, the patient was placed in dorsal supine position.  The patient's legs were placed in the [place].  The patient's abdomen and perineum were prepped and draped in a normal sterile fashion.  Foley catheter was previously placed.  Time-out was performed.  The patient did receive 2 g IV Ancef prior to commencement of the case.  A Pfannenstiel incision was made 2 fingerbreadths above the symphysis pubis.  Sharp dissection was used to identify the fascia.  Fascia was opened in midline and opened in a transverse fashion.  The superior aspect of the fascia was grasped with Kocher clamps and the recti muscles dissected free.  The inferior aspect of the fascia was grasped with Kocher clamps and pyramidalis muscles dissected free.  Peritoneum was grasped and opened sharply.  The [employer] retractor was placed in the incision.  The bowel was packed with moist laparotomy sponges  cephalad.  Large uterus was delivered through the retractor. Round ligaments on both sides were clamped, transected, and suture ligated with 0 Vicryl suture.  Anterior leaf of the broad ligament was incised along the bladder reflection to the midline from both sides. The bladder was gently dissected off the lower uterine segment with sharp and blunt dissection.  A window of the broad ligament was made and the uteroovarian ligament with proximal portion of the fallopian tube were then clamped, transected, and suture ligated with 0 Vicryl suture. Uterine arteries were then skeletonized bilaterally and clamped with Haney clamps, transected, and suture ligated with 0 Vicryl suture.  Good hemostasis was noted.  Cardinal ligaments were then clamped with a straight Haney clamp, transected, and suture ligated with 0 Vicryl suture.  Curved Haney clamps were used to clamp the vaginal angles and the cervix and uterus were removed.  Of note, the bulk of the uterus was amputated prior to removal of the cervix.  Vaginal cuff angles were closed with figure-of-eight 0 Vicryl suture and the rest of the vaginal cuff was closed with interrupted 0 Vicryl suture.  Good hemostasis was noted.  Each fallopian tube was then clamped through the mesosalpinx and doubly ligated.  Ovaries remained in situ.  Pelvis was then copiously  irrigated with water and all laparotomy sponges were removed from the abdomen.  The fascia was then closed with 0 Vicryl suture with good approximation of edges.  The fascial edges were then injected with a solution of 1.3% Exparel 20 mL plus 0.5% Marcaine 30 mL and 50 mL of normal saline.  60 mL of the solution were injected in the fascial edges.  Subcutaneous tissues were irrigated and bovied for hemostasis and given the depth of the subcutaneous tissue of approximately 4 cm, the dead space was closed with a running 2-0 chromic suture and the skin was reapproximated with Insorb absorbable  staples.  Additional 30 mL of Exparel solution was injected into the incision line.  There were no complications.  ESTIMATED BLOOD LOSS:  100 mL.  URINE OUTPUT:  200 mL.  INTRAOPERATIVE FLUIDS:  1100 mL.  The patient was taken to recovery room in good condition.          ______________________________ Laverta Baltimore, MD     TS/MEDQ  D:  04/01/2018  T:  04/02/2018  Job:  [id number]

## 2018-04-02 NOTE — Progress Notes (Signed)
RN in room to give toradol; pt sitting on edge of bed; pt reports to RN that she has "numbing in my left leg from about my ankle to my upper thigh almost my groin"; RN assisted pt to void; pt IS able to ambulate; RN checked pulses and color in both feet and they are equal and WNL; pt reports that this numbing starting after laying on her left side "sleeping and I slept really good and when I woke up my leg felt numb"; pt does have history of degenerative disc in back and goes to the chiropractor once a month; RN reminded pt to definitely call for assistance out of bed; RN told pt that she would inform anesthesia and her MD about this numbing

## 2018-04-02 NOTE — Progress Notes (Signed)
1 Day Post-Op Procedure(s) (LRB): HYSTERECTOMY ABDOMINAL WITH SALPINGECTOMY (Bilateral)  Subjective: Patient reports no problems voiding.   C/o numbness left inner calf . Pt admits to back DDD, no prior paresthesias.No muscle weakness . She is able to walk  Objective: I have reviewed patient's vital signs and intake and output.  General: alert and cooperative Resp: clear to auscultation bilaterally Cardio: regular rate and rhythm, S1, S2 normal, no murmur, click, rub or gallop GI: soft, non-tender; bowel sounds normal; no masses,  no organomegaly LLE , M/S strength 5+/5  DTR nl   Assessment: s/p Procedure(s): HYSTERECTOMY ABDOMINAL WITH SALPINGECTOMY (Bilateral): stable and paresthesia LLE medial calf , probably transient neuropathy secondary to position on table during surgery . No musclular weakness. Cont to follow   Plan: Advance diet Discontinue IV fluids  Follow neuropathy . If worse tomorrow will consider neurology consult   LOS: 1 day    Gwen Her Latashia Koch 04/02/2018, 9:25 AM

## 2018-04-02 NOTE — Plan of Care (Signed)
Vs stable; up ad lib; up to bathroom to void with assistance; tolerating liquids well; is ready for "real food" (MD to round on pt and let RN know when pt's diet can be advanced to regular); taking PO percocet and IV toradol for pain control; 1 area on abdominal dressing marked

## 2018-04-02 NOTE — Plan of Care (Signed)
Start of shift; toradol dc'd and motrin ordered; pt educated about this pain med change; pt just finished ambulating in hallway with her husband

## 2018-04-02 NOTE — Progress Notes (Signed)
Dr. Ouida Sills notified patient c/o numbness and tingling on inner left leg. Pt able to move leg and ambulate with assistance (for safety) without problem.

## 2018-04-03 MED ORDER — OXYCODONE-ACETAMINOPHEN 5-325 MG PO TABS: 1.0000 | ORAL_TABLET | ORAL | 0 refills | Status: AC | PRN

## 2018-04-03 MED ORDER — IBUPROFEN 800 MG PO TABS: 800.0000 mg | ORAL_TABLET | Freq: Three times a day (TID) | ORAL | 0 refills | Status: AC | PRN

## 2018-04-03 MED ORDER — DOCUSATE SODIUM 100 MG PO CAPS: 100.0000 mg | ORAL_CAPSULE | Freq: Every day | ORAL | 2 refills | Status: AC | PRN

## 2018-04-03 MED ORDER — POLYETHYLENE GLYCOL 3350 17 G PO PACK: 17.0000 g | PACK | Freq: Every day | ORAL | 0 refills | Status: AC

## 2018-04-03 NOTE — Progress Notes (Addendum)
Discharge inst reviewed with pt and SO.  Pt verb u/o.  Talked with pt about progressing diet with soft, bland foods such as dry toast, rice, potatoe.  Encouraged ambulation and continue meds as perscribed.  Pt denies any N/V

## 2018-04-03 NOTE — Progress Notes (Signed)
Pt main complaint this shift was constipation and abdominal distention; RN notified on-call MD; orders for glycerin suppository and miralax were given to RN; meds given; pt did have results (pt had several bowel movements; started with small amounts and progressed to larger amounts but sometimes had semi-formed stool and liquid stool); abdominal distention is improved as well; pt was +flatus, urinating well, ambulating well and taking PO pain meds at start of shift; at this time (end of shift) pt has had BM as well and has been able to rest from 0300 and on; pt requested that RN NOT wake her at 0440 for motrin; RN has opened door and checked on pt several times since 0300

## 2018-04-03 NOTE — Progress Notes (Signed)
Discharge to home via auxillary

## 2018-04-03 NOTE — Discharge Summary (Signed)
Physician Discharge Summary  Patient ID: Tammy Baldwin MRN: [id number] DOB/AGE: [birth year] 52 y.o.  Admit date: 04/01/2018 Discharge date: 04/03/2018  Admission Diagnoses:menorrhagia , fibroid uterus  Discharge Diagnoses:  Active Problems:   Postoperative state   Discharged Condition: good  Hospital Course: uncomplicated TAH  + bilateral salpingectomy . POD#2 tolerating regular food and + flatus  Consults: None  Significant Diagnostic Studies: labs:  Results for orders placed or performed during the hospital encounter of 04/01/18 (from the past 72 hour(s))  Pregnancy, urine POC     Status: None   Collection Time: 04/01/18  9:27 AM  Result Value Ref Range   Preg Test, Ur NEGATIVE NEGATIVE    Comment:        THE SENSITIVITY OF THIS METHODOLOGY IS >24 mIU/mL   ABO/Rh     Status: None   Collection Time: 04/01/18  9:51 AM  Result Value Ref Range   ABO/RH(D)      A POS Performed at [facility], [address], [place], [place] [address]   Surgical pathology     Status: None   Collection Time: 04/01/18 12:30 PM  Result Value Ref Range   SURGICAL PATHOLOGY      Surgical Pathology CASE: [id number] PATIENT: Tammy Baldwin Surgical Pathology Report     SPECIMEN SUBMITTED: A. Uterus with cervix and bilateral tubes  CLINICAL HISTORY: None provided  PRE-OPERATIVE DIAGNOSIS: menorrhagia, fibroids  POST-OPERATIVE DIAGNOSIS: Same as pre op     DIAGNOSIS: A.  UTERUS WITH CERVIX AND BILATERAL FALLOPIAN TUBES; TOTAL HYSTERECTOMY WITH BILATERAL SALPINGECTOMY: - CERVIX WITH TUBAL METAPLASIA. - DISORDERED PROLIFERATIVE ENDOMETRIUM. 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 - TWO FALLOPIAN TUBES WITH BENIGN PARATUBAL CYST. - NEGATIVE FOR ATYPIA AND MALIGNANCY.   GROSS DESCRIPTION: A. Labeled: Uterus with cervix, bilateral tubes Received: In formalin Weight: 4 fragments, aggregate 583 grams Dimensions:      Fundus -11.0 x 10.2 x 7.8 cm      Cervix  -2.5 x 2.6 cm with an external loss of 0.3 cm Serosa: Purple to tan wrinkled and focally distorted (multiple fragments and cannot orient) Cervix: Smooth Tan Endocervi x: Trabecular pink-tan Endometrial cavity:      Dimensions -2.2 x 2.0 cm      Thickness -0.1 cm      Other findings -compressed by surrounding intramural nodules Myometrium:     Thickness -6.2 cm     Other findings -multiple white-pink whorled nodules ranging from 0.6 up to 6.1cm Adnexa: (attached)      First fallopian tube           Measurements -1.1 cm in length x 0.5 cm in diameter           Other findings -purple-tan truncated         Second fallopian tube            Measurements -1.5 cm in length x 0.4 cm in diameter           Other findings -pink truncated Other comments: Free-floating is a 4.5 cm long up to 1.6 cm in diameter fimbriated pink dilated  fallopian tube containing clear viscous fluid  Block summary: 1-2 - representative cervix 3-4 - representative endomyometrium 5 - representative cross-section first fallopian tube attached 6 - representative cross-section second fallopian tube attached 7 - representative cross-section and longitudinal fimbria ted end detached fallopian tube 8-9 - representative whorled nodules   Final Diagnosis performed by Quay Burow, MD.   Electronically signed 04/02/2018 11:07:03AM The  electronic signature indicates that the named Attending Pathologist has evaluated the specimen  Technical component performed at [facility], [address] [place], [place] [address] Lab: [phone] Dir: Rush Farmer, MD, MMM  Professional component performed at [facility], [facility], [place], [place], [place] [address] Lab: [phone] Dir: Dellia Nims. Rubinas, MD   CBC     Status: Abnormal   Collection Time: 04/02/18  6:25 AM  Result Value Ref Range   WBC 14.2 (H) 3.6 - 11.0 K/uL   RBC 3.90 3.80 - 5.20 MIL/uL   Hemoglobin 12.8 12.0 - 16.0 g/dL   HCT 37.4 35.0  - 47.0 %   MCV 95.9 80.0 - 100.0 fL   MCH 32.8 26.0 - 34.0 pg   MCHC 34.2 32.0 - 36.0 g/dL   RDW 13.3 11.5 - 14.5 %   Platelets 287 150 - 440 K/uL    Comment: Performed at [facility], [address] [place], [place] [address]  Basic metabolic panel     Status: Abnormal   Collection Time: 04/02/18  6:25 AM  Result Value Ref Range   Sodium 137 135 - 145 mmol/L   Potassium 4.3 3.5 - 5.1 mmol/L   Chloride 107 101 - 111 mmol/L   CO2 26 22 - 32 mmol/L   Glucose, Bld 117 (H) 65 - 99 mg/dL   BUN 13 6 - 20 mg/dL   Creatinine, Ser 0.78 0.44 - 1.00 mg/dL   Calcium 8.1 (L) 8.9 - 10.3 mg/dL   GFR calc non Af Amer >60 >60 mL/min   GFR calc Af Amer >60 >60 mL/min    Comment: (NOTE) The eGFR has been calculated using the CKD EPI equation. This calculation has not been validated in all clinical situations. eGFR's persistently <60 mL/min signify possible Chronic Kidney Disease.    Anion gap 4 (L) 5 - 15    Comment: Performed at [facility], [address] [place], [place] [address]   Treatments: surgery: as above   Discharge Exam: Blood pressure 137/75, pulse (!) 58, temperature 98.2 F (36.8 C), temperature source Oral, resp. rate 19, height '5\' 4"'$  (1.626 m), weight 79.4 kg (175 lb), SpO2 100 %. General appearance: alert and cooperative Resp: clear to auscultation bilaterally Cardio: regular rate and rhythm, S1, S2 normal, no murmur, click, rub or gallop GI: soft, non-tender; bowel sounds normal; no masses,  no organomegaly Incision/Wound:C/D/I  Disposition: Discharge disposition: 01-Home or Self Care       Discharge Instructions    Call MD for:  difficulty breathing, headache or visual disturbances   Complete by:  As directed    Call MD for:  extreme fatigue   Complete by:  As directed    Call MD for:  hives   Complete by:  As directed    Call MD for:  persistant dizziness or light-headedness   Complete by:  As directed    Call MD for:  persistant  nausea and vomiting   Complete by:  As directed    Call MD for:  redness, tenderness, or signs of infection (pain, swelling, redness, odor or green/yellow discharge around incision site)   Complete by:  As directed    Call MD for:  severe uncontrolled pain   Complete by:  As directed    Call MD for:  temperature >100.4   Complete by:  As directed    Diet - low sodium heart healthy   Complete by:  As directed    Increase  activity slowly   Complete by:  As directed      Allergies as of 04/03/2018      Reactions   Campho-phenique [phenol] Swelling, Other (See Comments)   Lips swelling   Penicillins Rash, Other (See Comments)   Has patient had a PCN reaction causing immediate rash, facial/tongue/throat swelling, SOB or lightheadedness with hypotension: Unknown Has patient had a PCN reaction causing severe rash involving mucus membranes or skin necrosis: Unknown Has patient had a PCN reaction that required hospitalization: Unknown Has patient had a PCN reaction occurring within the last 10 years: No If all of the above answers are "NO", then may proceed with Cephalosporin use.      Medication List    STOP taking these medications   DHEA PO   FISH OIL PO   GLUCOSAMINE PO   MULTIVITAMIN PO   vitamin C 1000 MG tablet     TAKE these medications   docusate sodium 100 MG capsule Commonly known as:  COLACE Take 1 capsule (100 mg total) by mouth daily as needed.   ibuprofen 800 MG tablet Commonly known as:  ADVIL,MOTRIN Take 1 tablet (800 mg total) by mouth every 8 (eight) hours as needed for mild pain. What changed:    medication strength  how much to take  when to take this  reasons to take this   oxyCODONE-acetaminophen 5-325 MG tablet Commonly known as:  PERCOCET/ROXICET Take 1-2 tablets by mouth every 4 (four) hours as needed for moderate pain ((when tolerating fluids)).   polyethylene glycol packet Commonly known as:  MIRALAX / GLYCOLAX Take 17 g by mouth  daily.      Follow-up Information    Schermerhorn, Gwen Her, MD Follow up in 2 week(s).   Specialty:  Obstetrics and Gynecology Why:  post op  Contact information: [address] [place] [address] [phone]           Signed: Gwen Her Schermerhorn 04/03/2018, 8:50 AM

## 2018-04-03 NOTE — Progress Notes (Signed)
No left leg numbing reported by pt to RN this shift

## 2019-02-14 ENCOUNTER — Other Ambulatory Visit: Payer: Self-pay | Admitting: Family Medicine

## 2019-02-14 DIAGNOSIS — Z1231 Encounter for screening mammogram for malignant neoplasm of breast: Secondary | ICD-10-CM

## 2019-05-22 ENCOUNTER — Other Ambulatory Visit: Payer: Self-pay

## 2019-05-22 ENCOUNTER — Ambulatory Visit
Admission: RE | Admit: 2019-05-22 | Discharge: 2019-05-22 | Disposition: A | Discharge status: Home / Self Care | Payer: 59 | Source: Ambulatory Visit | Attending: Family Medicine | Admitting: Family Medicine

## 2019-05-22 DIAGNOSIS — Z1231 Encounter for screening mammogram for malignant neoplasm of breast: Secondary | ICD-10-CM | POA: Diagnosis present

## 2020-04-13 ENCOUNTER — Other Ambulatory Visit: Payer: Self-pay | Admitting: Family Medicine

## 2020-04-13 DIAGNOSIS — Z1231 Encounter for screening mammogram for malignant neoplasm of breast: Secondary | ICD-10-CM

## 2020-05-25 ENCOUNTER — Ambulatory Visit
Admission: RE | Admit: 2020-05-25 | Discharge: 2020-05-25 | Disposition: A | Discharge status: Home / Self Care | Payer: 59 | Source: Ambulatory Visit | Attending: Family Medicine | Admitting: Family Medicine

## 2020-05-25 DIAGNOSIS — Z1231 Encounter for screening mammogram for malignant neoplasm of breast: Secondary | ICD-10-CM | POA: Diagnosis present

## 2021-05-26 ENCOUNTER — Other Ambulatory Visit: Payer: Self-pay | Admitting: Family Medicine

## 2021-05-26 DIAGNOSIS — Z1231 Encounter for screening mammogram for malignant neoplasm of breast: Secondary | ICD-10-CM

## 2021-06-08 ENCOUNTER — Ambulatory Visit
Admission: RE | Admit: 2021-06-08 | Discharge: 2021-06-08 | Disposition: A | Discharge status: Home / Self Care | Payer: 59 | Source: Ambulatory Visit | Attending: Family Medicine | Admitting: Family Medicine

## 2021-06-08 ENCOUNTER — Other Ambulatory Visit: Payer: Self-pay

## 2021-06-08 DIAGNOSIS — Z1231 Encounter for screening mammogram for malignant neoplasm of breast: Secondary | ICD-10-CM

## 2022-06-08 ENCOUNTER — Other Ambulatory Visit: Payer: Self-pay | Admitting: Family Medicine

## 2022-06-08 DIAGNOSIS — Z1231 Encounter for screening mammogram for malignant neoplasm of breast: Secondary | ICD-10-CM

## 2022-07-25 ENCOUNTER — Ambulatory Visit
Admission: RE | Admit: 2022-07-25 | Discharge: 2022-07-25 | Disposition: A | Discharge status: Home / Self Care | Payer: 59 | Source: Ambulatory Visit | Attending: Family Medicine | Admitting: Family Medicine

## 2022-07-25 DIAGNOSIS — Z1231 Encounter for screening mammogram for malignant neoplasm of breast: Secondary | ICD-10-CM | POA: Diagnosis present

## 2022-08-20 IMAGING — MG MM DIGITAL SCREENING BILAT W/ TOMO AND CAD
8 series · 8 of 24 positions shown · non-contrast
Comparison: Previous exam(s).

CLINICAL DATA: Screening.

EXAM:
DIGITAL SCREENING BILATERAL MAMMOGRAM WITH TOMOSYNTHESIS AND CAD
TECHNIQUE: Bilateral screening digital craniocaudal and mediolateral oblique
mammograms were obtained. Bilateral screening digital breast
tomosynthesis was performed. The images were evaluated with
computer-aided detection.

[L MLO synth-2D]
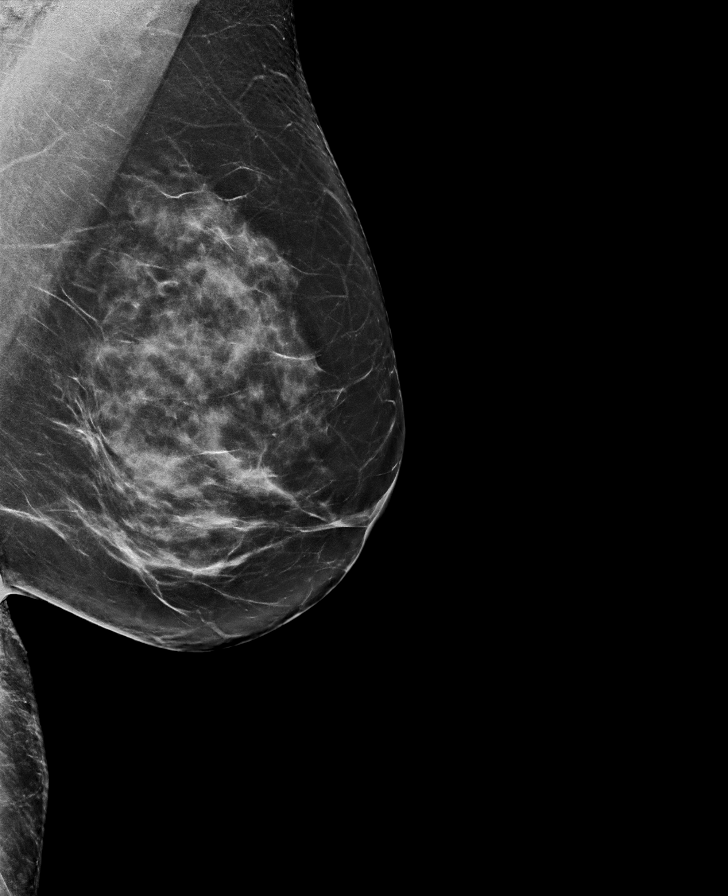

[L CC synth-2D]
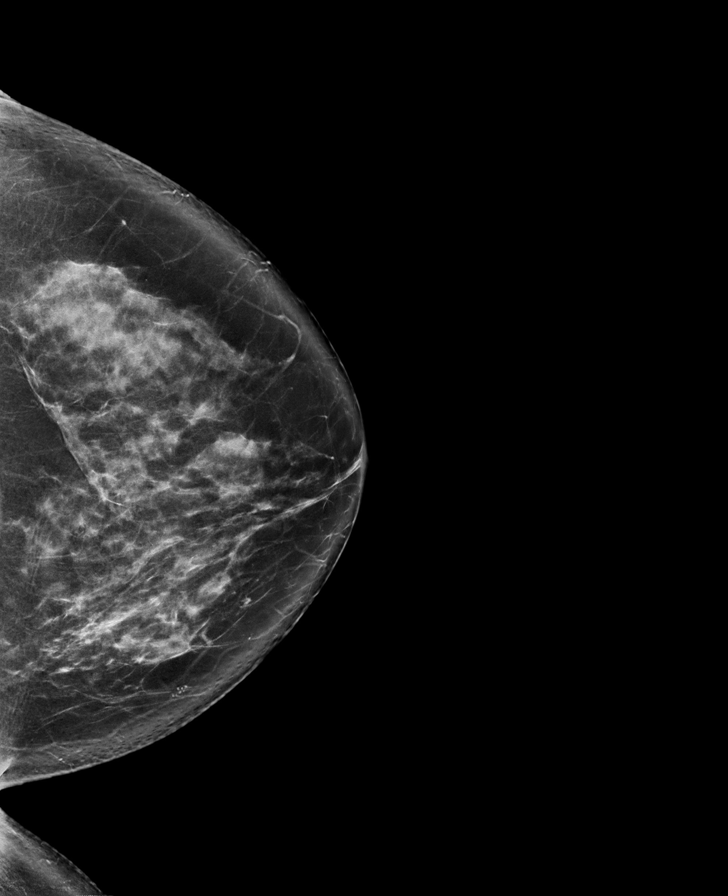

[R CC synth-2D]
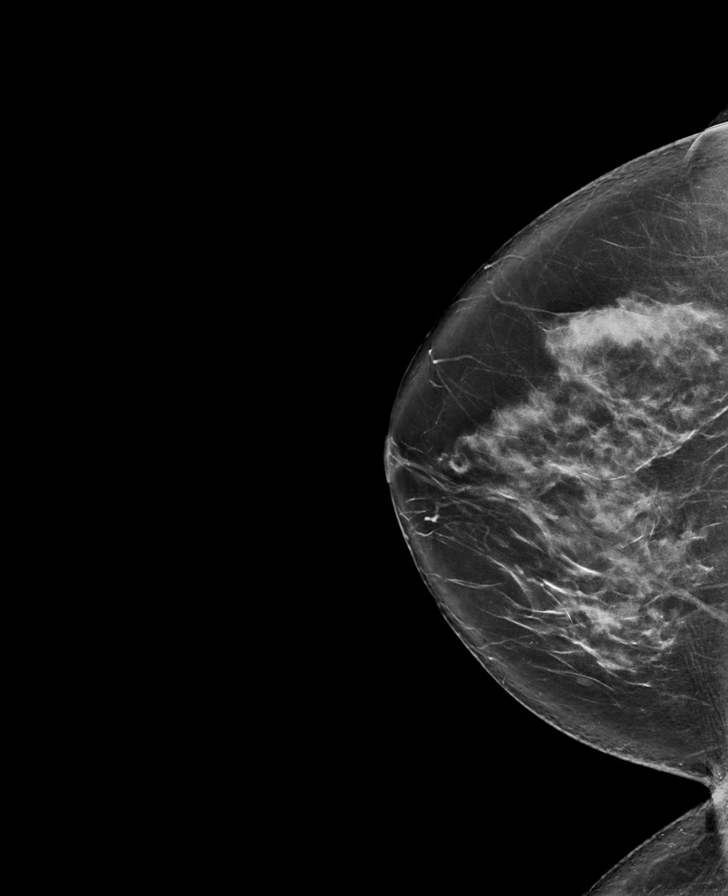

[R MLO synth-2D]
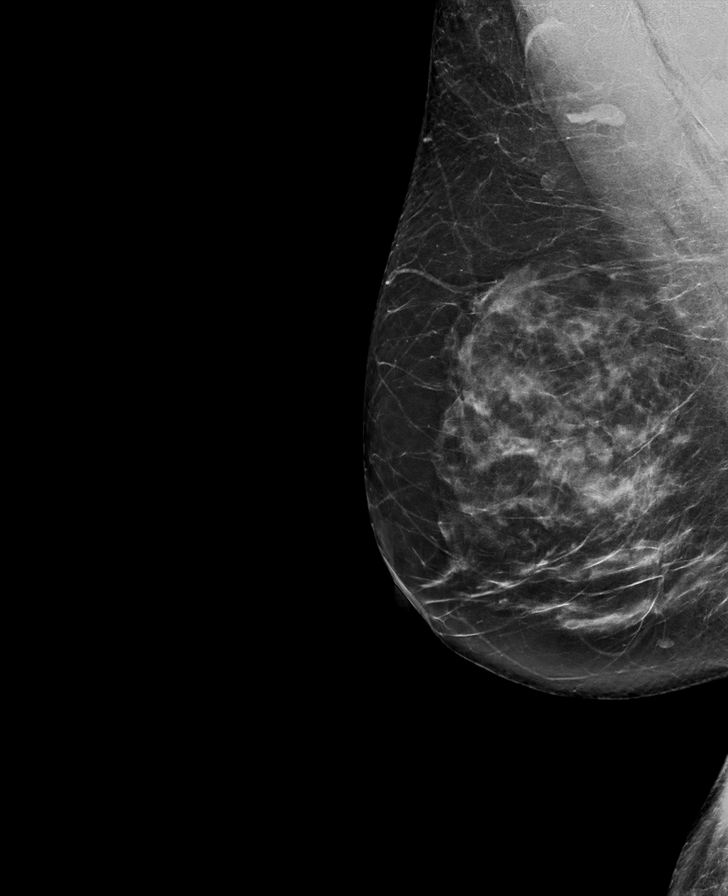

[L CC tomo · tomo slice 43/86.0]
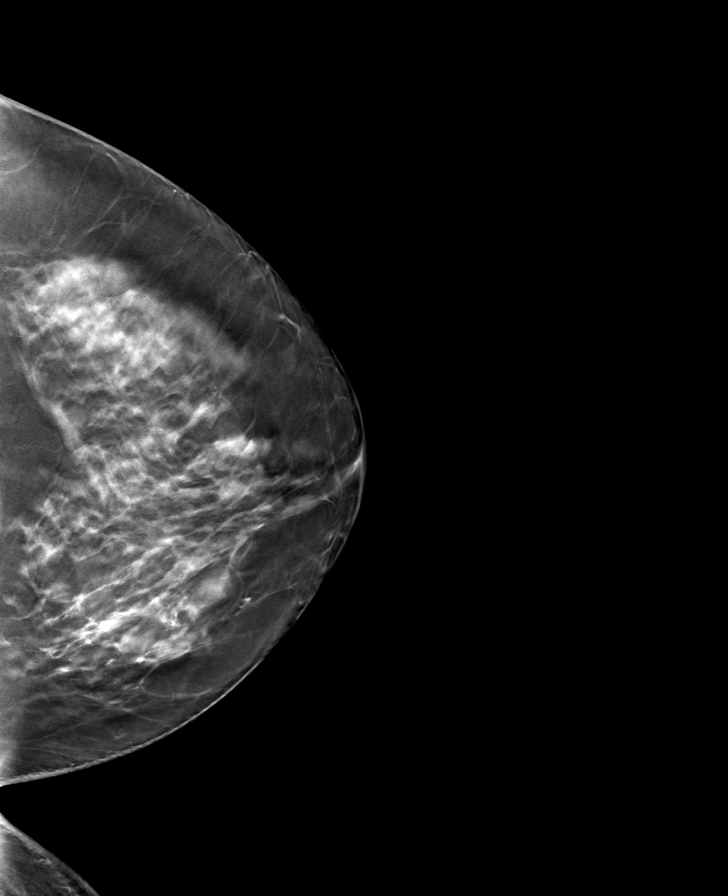

[L MLO tomo · tomo slice 43/85.0]
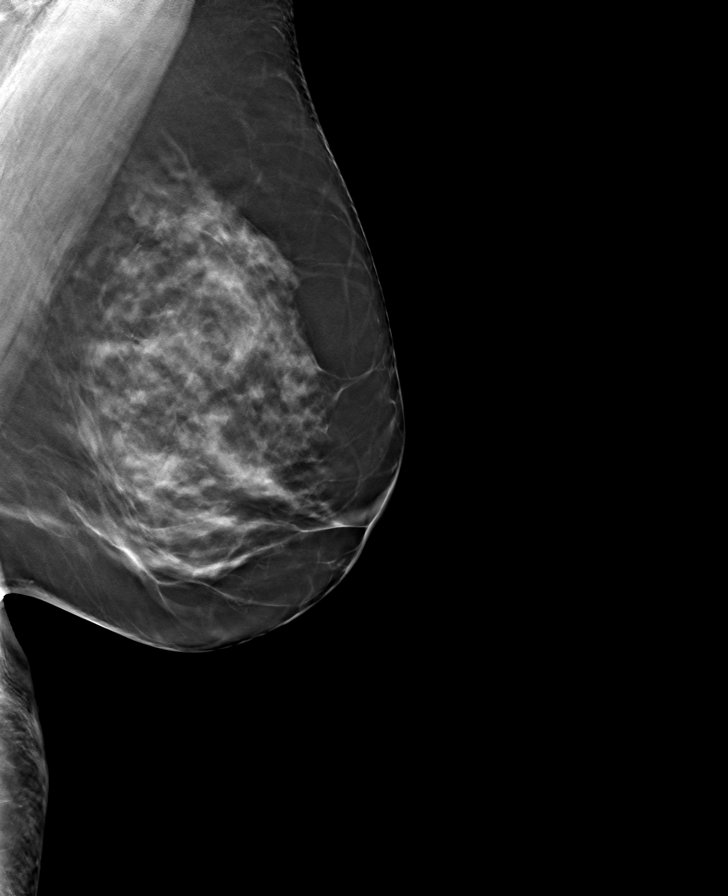

[R CC tomo · tomo slice 44/87.0]
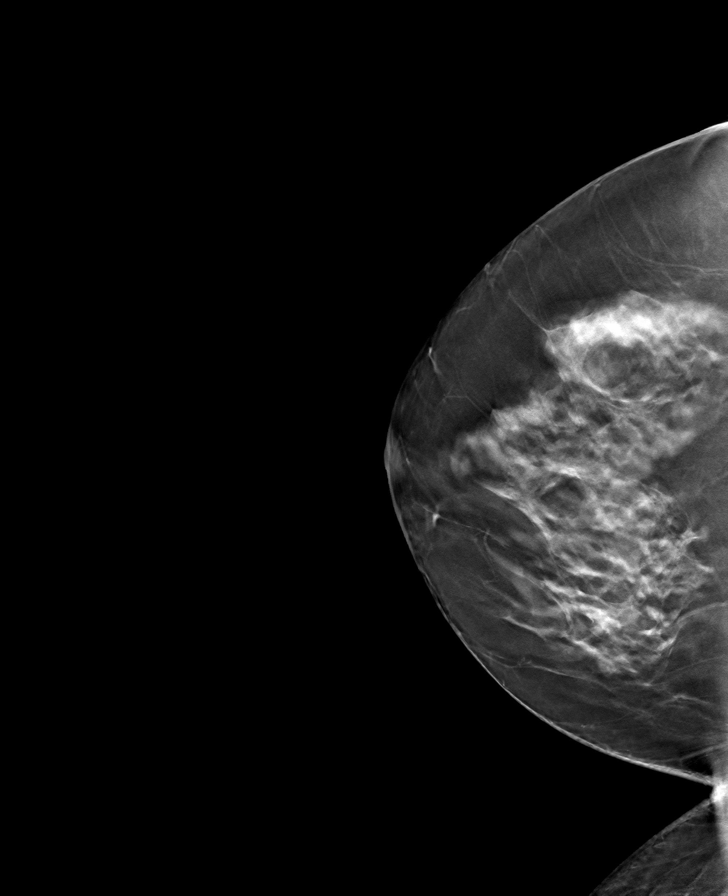

[R MLO tomo · tomo slice 42/83.0]
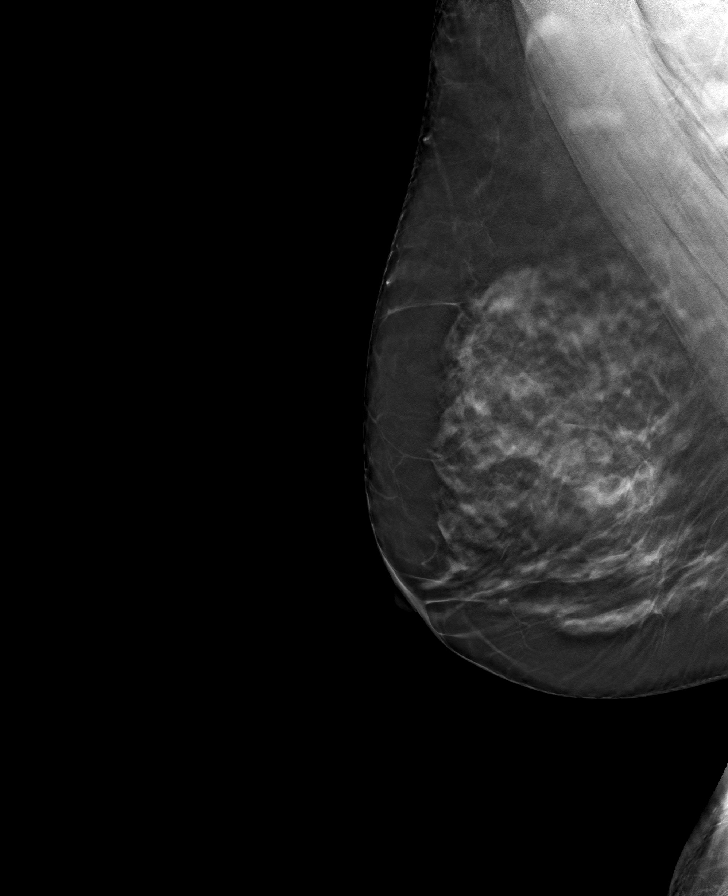

[8 of 24 positions shown; findings below may reference images not displayed]

ACR Breast Density Category c: The breast tissue is heterogeneously
dense, which may obscure small masses.
FINDINGS: There are no findings suspicious for malignancy.
IMPRESSION: No mammographic evidence of malignancy. A result letter of this
screening mammogram will be mailed directly to the patient.

RECOMMENDATION:
Screening mammogram in one year. (Code:Q3-W-BC3)

BI-RADS CATEGORY  1: Negative.

## 2023-03-15 DIAGNOSIS — K5792 Diverticulitis of intestine, part unspecified, without perforation or abscess without bleeding: Secondary | ICD-10-CM | POA: Insufficient documentation

## 2023-04-01 DIAGNOSIS — K5720 Diverticulitis of large intestine with perforation and abscess without bleeding: Secondary | ICD-10-CM | POA: Insufficient documentation

## 2023-04-02 ENCOUNTER — Other Ambulatory Visit: Payer: Self-pay

## 2023-04-02 DIAGNOSIS — N939 Abnormal uterine and vaginal bleeding, unspecified: Secondary | ICD-10-CM | POA: Insufficient documentation

## 2023-04-04 ENCOUNTER — Ambulatory Visit: Payer: 59 | Admitting: Physician Assistant

## 2023-07-09 ENCOUNTER — Other Ambulatory Visit: Payer: Self-pay | Admitting: Family Medicine

## 2023-07-09 DIAGNOSIS — Z1231 Encounter for screening mammogram for malignant neoplasm of breast: Secondary | ICD-10-CM

## 2023-07-31 ENCOUNTER — Ambulatory Visit
Admission: RE | Admit: 2023-07-31 | Discharge: 2023-07-31 | Disposition: A | Discharge status: Home / Self Care | Payer: 59 | Source: Ambulatory Visit | Attending: Family Medicine | Admitting: Family Medicine

## 2023-07-31 DIAGNOSIS — Z1231 Encounter for screening mammogram for malignant neoplasm of breast: Secondary | ICD-10-CM | POA: Insufficient documentation

## 2024-08-06 ENCOUNTER — Other Ambulatory Visit: Payer: Self-pay | Admitting: Family Medicine

## 2024-08-06 DIAGNOSIS — Z1231 Encounter for screening mammogram for malignant neoplasm of breast: Secondary | ICD-10-CM

## 2024-08-27 ENCOUNTER — Ambulatory Visit
Admission: RE | Admit: 2024-08-27 | Discharge: 2024-08-27 | Disposition: A | Discharge status: Home / Self Care | Source: Ambulatory Visit | Attending: Family Medicine | Admitting: Family Medicine

## 2024-08-27 DIAGNOSIS — Z1231 Encounter for screening mammogram for malignant neoplasm of breast: Secondary | ICD-10-CM | POA: Diagnosis present
